# Patient Record
Sex: Female | Born: 1985
Health system: Southern US, Community
[De-identification: ages and names within clinical notes are randomized; demographics above are authoritative.]

## PROBLEM LIST (undated history)

## (undated) DIAGNOSIS — N200 Calculus of kidney: Secondary | ICD-10-CM

## (undated) DIAGNOSIS — N189 Chronic kidney disease, unspecified: Secondary | ICD-10-CM

---

## 2015-02-05 ENCOUNTER — Emergency Department (HOSPITAL_COMMUNITY): Payer: 59

## 2015-02-05 ENCOUNTER — Emergency Department (HOSPITAL_COMMUNITY)
Admission: EM | Admit: 2015-02-05 | Discharge: 2015-02-05 | Disposition: A | Discharge status: Home / Self Care | Payer: 59 | Attending: Emergency Medicine | Admitting: Emergency Medicine

## 2015-02-05 ENCOUNTER — Encounter (HOSPITAL_COMMUNITY): Payer: Self-pay | Admitting: Emergency Medicine

## 2015-02-05 DIAGNOSIS — N23 Unspecified renal colic: Secondary | ICD-10-CM | POA: Diagnosis not present

## 2015-02-05 DIAGNOSIS — Z3202 Encounter for pregnancy test, result negative: Secondary | ICD-10-CM | POA: Insufficient documentation

## 2015-02-05 DIAGNOSIS — R52 Pain, unspecified: Secondary | ICD-10-CM

## 2015-02-05 DIAGNOSIS — R112 Nausea with vomiting, unspecified: Secondary | ICD-10-CM | POA: Insufficient documentation

## 2015-02-05 DIAGNOSIS — M549 Dorsalgia, unspecified: Secondary | ICD-10-CM | POA: Diagnosis present

## 2015-02-05 LAB — URINE MICROSCOPIC-ADD ON

## 2015-02-05 LAB — COMPREHENSIVE METABOLIC PANEL
ALBUMIN: 4.5 g/dL (ref 3.5–5.2)
ALK PHOS: 68 U/L (ref 39–117)
ALT: 32 U/L (ref 0–35)
ANION GAP: 8 (ref 5–15)
AST: 24 U/L (ref 0–37)
BILIRUBIN TOTAL: 0.3 mg/dL (ref 0.3–1.2)
BUN: 16 mg/dL (ref 6–23)
CHLORIDE: 108 mmol/L (ref 96–112)
CO2: 22 mmol/L (ref 19–32)
Calcium: 9.1 mg/dL (ref 8.4–10.5)
Creatinine, Ser: 1.47 mg/dL — ABNORMAL HIGH (ref 0.50–1.10)
GFR calc Af Amer: 55 mL/min — ABNORMAL LOW (ref >90)
GFR calc non Af Amer: 47 mL/min — ABNORMAL LOW (ref >90)
GLUCOSE: 102 mg/dL — AB (ref 70–99)
Potassium: 4.3 mmol/L (ref 3.5–5.1)
SODIUM: 138 mmol/L (ref 135–145)
Total Protein: 8.8 g/dL — ABNORMAL HIGH (ref 6.0–8.3)

## 2015-02-05 LAB — URINALYSIS, ROUTINE W REFLEX MICROSCOPIC
BILIRUBIN URINE: NEGATIVE
GLUCOSE, UA: NEGATIVE mg/dL
Hgb urine dipstick: NEGATIVE
KETONES UR: NEGATIVE mg/dL
Nitrite: NEGATIVE
PROTEIN: NEGATIVE mg/dL
Specific Gravity, Urine: 1.016 (ref 1.005–1.030)
Urobilinogen, UA: 0.2 mg/dL (ref 0.0–1.0)
pH: 6 (ref 5.0–8.0)

## 2015-02-05 LAB — CBC
HEMATOCRIT: 37 % (ref 36.0–46.0)
Hemoglobin: 11.8 g/dL — ABNORMAL LOW (ref 12.0–15.0)
MCH: 28.4 pg (ref 26.0–34.0)
MCHC: 31.9 g/dL (ref 30.0–36.0)
MCV: 88.9 fL (ref 78.0–100.0)
PLATELETS: 289 10*3/uL (ref 150–400)
RBC: 4.16 MIL/uL (ref 3.87–5.11)
RDW: 13.8 % (ref 11.5–15.5)
WBC: 8.5 10*3/uL (ref 4.0–10.5)

## 2015-02-05 LAB — POC URINE PREG, ED: Preg Test, Ur: NEGATIVE

## 2015-02-05 MED ORDER — KETOROLAC TROMETHAMINE 60 MG/2ML IM SOLN
60.0000 mg | Freq: Once | INTRAMUSCULAR | Status: AC
  Administered 2015-02-05: 60 mg via INTRAMUSCULAR
  Filled 2015-02-05: qty 2

## 2015-02-05 MED ORDER — TAMSULOSIN HCL 0.4 MG PO CAPS: 0.4000 mg | ORAL_CAPSULE | Freq: Every day | ORAL | Status: AC

## 2015-02-05 MED ORDER — ONDANSETRON 8 MG PO TBDP: 8.0000 mg | ORAL_TABLET | Freq: Three times a day (TID) | ORAL | Status: AC | PRN

## 2015-02-05 MED ORDER — OXYCODONE-ACETAMINOPHEN 5-325 MG PO TABS: 1.0000 | ORAL_TABLET | ORAL | Status: AC | PRN

## 2015-02-05 NOTE — ED Provider Notes (Signed)
MSE was initiated and I personally evaluated the patient and placed orders (if any) at  10:29 AM on February 05, 2015.  Patient presenting with right-sided low back pain, radiating around her flank. Was seen at urgent care twice for the same, initially diagnosed with UTI, given an IM injection, Zofran and an antibiotic, returned to the urgent care, and was diagnosed with a sinus infection, taken off the antibiotic for the UTI and started on Levaquin. Yesterday, she started to feel better, however this morning, she woke up and the pain returned with associated nausea and vomiting. States she feels dehydrated and has no appetite. I do not feel patient is appropriate for fast track and will be moved to the main side of the emergency department. On exam, she is nontoxic appearing, NAD. Abdomen is soft with right-sided tenderness. No peritoneal signs.  The patient appears stable so that the remainder of the MSE may be completed by another provider.  Kathrynn SpeedRobyn M Bartlomiej Jenkinson, PA-C 02/05/15 [address]  Jonica Bickhart M JohnsonburgHess, PA-C 02/05/15 1031  Shon Batonourtney F Horton, MD 02/05/15 2038

## 2015-02-05 NOTE — ED Provider Notes (Signed)
CSN: [id number]     Arrival date & time 02/05/15  0906 History   First MD Initiated Contact with Patient 02/05/15 [id number]     Chief Complaint  Patient presents with  . Back Pain    right lower  . Nausea  . Emesis     (Consider location/radiation/quality/duration/timing/severity/associated sxs/prior Treatment) HPI Comments: Patient here complaining of right-sided flank pain which is been colicky 2 days. Seen at urgent care diagnosed a UTI place on antibiotics. Went back day later due to musculoskeletal back pain as well as sinus pressure and had her antibiotics switched from Levaquin to Bactrim. She now notes persistent pain in her right flank without fever but with nausea vomiting however no diarrhea. Symptoms are persistent and are not worse with movement. Denies any vaginal bleeding or discharge. No prior history of renal colic  Patient is a 29 y.o. female presenting with back pain and vomiting. The history is provided by the patient.  Back Pain Emesis   History reviewed. No pertinent past medical history. History reviewed. No pertinent past surgical history. History reviewed. No pertinent family history. History  Substance Use Topics  . Smoking status: Never Smoker   . Smokeless tobacco: Not on file  . Alcohol Use: Yes     Comment: occ   OB History    No data available     Review of Systems  Gastrointestinal: Positive for vomiting.  Musculoskeletal: Positive for back pain.  All other systems reviewed and are negative.     Allergies  Review of patient's allergies indicates no known allergies.  Home Medications   Prior to Admission medications   Not on File   BP 140/83 mmHg  Pulse 95  Temp(Src) 99 F (37.2 C) (Oral)  Resp 16  SpO2 100%  LMP 11/28/2014 (Approximate) Physical Exam  Constitutional: She is oriented to person, place, and time. She appears well-developed and well-nourished.  Non-toxic appearance. No distress.  HENT:  Head: Normocephalic and  atraumatic.  Eyes: Conjunctivae, EOM and lids are normal. Pupils are equal, round, and reactive to light.  Neck: Normal range of motion. Neck supple. No tracheal deviation present. No thyroid mass present.  Cardiovascular: Normal rate, regular rhythm and normal heart sounds.  Exam reveals no gallop.   No murmur heard. Pulmonary/Chest: Effort normal and breath sounds normal. No stridor. No respiratory distress. She has no decreased breath sounds. She has no wheezes. She has no rhonchi. She has no rales.  Abdominal: Soft. Normal appearance and bowel sounds are normal. She exhibits no distension. There is no tenderness. There is CVA tenderness. There is no rigidity, no rebound and no guarding.  Musculoskeletal: Normal range of motion. She exhibits no edema or tenderness.  Neurological: She is alert and oriented to person, place, and time. She has normal strength. No cranial nerve deficit or sensory deficit. GCS eye subscore is 4. GCS verbal subscore is 5. GCS motor subscore is 6.  Skin: Skin is warm and dry. No abrasion and no rash noted.  Psychiatric: She has a normal mood and affect. Her speech is normal and behavior is normal.  Nursing note and vitals reviewed.   ED Course  Procedures (including critical care time) Labs Review Labs Reviewed  URINALYSIS, ROUTINE W REFLEX MICROSCOPIC  CBC  COMPREHENSIVE METABOLIC PANEL  POC URINE PREG, ED    Imaging Review No results found.   EKG Interpretation None      MDM   Final diagnoses:  Pain   patient given Toradol here  with some relief. CT results noted and patient will begin referral to urology as well as placed on medications for pain    Lorre Nick, MD 02/05/15 1302

## 2015-02-05 NOTE — Discharge Instructions (Signed)
Ureteral Colic (Kidney Stones) °Ureteral colic is the result of a condition when kidney stones form inside the kidney. Once kidney stones are formed they may move into the tube that connects the kidney with the bladder (ureter). If this occurs, this condition may cause pain (colic) in the ureter.  °CAUSES  °Pain is caused by stone movement in the ureter and the obstruction caused by the stone. °SYMPTOMS  °The pain comes and goes as the ureter contracts around the stone. The pain is usually intense, sharp, and stabbing in character. The location of the pain may move as the stone moves through the ureter. When the stone is near the kidney the pain is usually located in the back and radiates to the belly (abdomen). When the stone is ready to pass into the bladder the pain is often located in the lower abdomen on the side the stone is located. At this location, the symptoms may mimic those of a urinary tract infection with urinary frequency. Once the stone is located here it often passes into the bladder and the pain disappears completely. °TREATMENT  °· Your caregiver will provide you with medicine for pain relief. °· You may require specialized follow-up X-rays. °· The absence of pain does not always mean that the stone has passed. It may have just stopped moving. If the urine remains completely obstructed, it can cause loss of kidney function or even complete destruction of the involved kidney. It is your responsibility and in your interest that X-rays and follow-ups as suggested by your caregiver are completed. Relief of pain without passage of the stone can be associated with severe damage to the kidney, including loss of kidney function on that side. °· If your stone does not pass on its own, additional measures may be taken by your caregiver to ensure its removal. °HOME CARE INSTRUCTIONS  °· Increase your fluid intake. Water is the preferred fluid since juices containing vitamin C may acidify the urine making it  less likely for certain stones (uric acid stones) to pass. °· Strain all urine. A strainer will be provided. Keep all particulate matter or stones for your caregiver to inspect. °· Take your pain medicine as directed. °· Make a follow-up appointment with your caregiver as directed. °· Remember that the goal is passage of your stone. The absence of pain does not mean the stone is gone. Follow your caregiver's instructions. °· Only take over-the-counter or prescription medicines for pain, discomfort, or fever as directed by your caregiver. °SEEK MEDICAL CARE IF:  °· Pain cannot be controlled with the prescribed medicine. °· You have a fever. °· Pain continues for longer than your caregiver advises it should. °· There is a change in the pain, and you develop chest discomfort or constant abdominal pain. °· You feel faint or pass out. °MAKE SURE YOU:  °· Understand these instructions. °· Will watch your condition. °· Will get help right away if you are not doing well or get worse. °Document Released: 07/08/2005 Document Revised: 01/23/2013 Document Reviewed: 03/25/2011 °[employer]® Patient Information ©2015 [employer]. This information is not intended to replace advice given to you by your health care provider. Make sure you discuss any questions you have with your health care provider. ° °

## 2015-02-05 NOTE — ED Notes (Signed)
[facility], states she was dx with UTI given 2 IM injections and given rx for nausea meds and an abx. Patient states on Sunday she went back to UC c/o headache. Patient states on this day she was dx with sinus infection, was told to stop the original abx, was given two new abx and prednisone. Patient states she was feeling better on Monday, but today woke up this am vomiting. Reports two episodes of emesis this am and continues to have low back pain 9/10.

## 2015-02-06 ENCOUNTER — Other Ambulatory Visit: Payer: Self-pay | Admitting: Urology

## 2015-02-07 ENCOUNTER — Encounter (HOSPITAL_COMMUNITY): Payer: Self-pay | Admitting: *Deleted

## 2015-02-11 ENCOUNTER — Encounter (HOSPITAL_COMMUNITY): Payer: Self-pay | Admitting: General Practice

## 2015-02-11 ENCOUNTER — Ambulatory Visit (HOSPITAL_COMMUNITY)
Admission: RE | Admit: 2015-02-11 | Discharge: 2015-02-11 | Disposition: A | Discharge status: Home / Self Care | Payer: 59 | Source: Ambulatory Visit | Attending: Urology | Admitting: Urology

## 2015-02-11 ENCOUNTER — Ambulatory Visit (HOSPITAL_COMMUNITY): Payer: 59

## 2015-02-11 ENCOUNTER — Encounter (HOSPITAL_COMMUNITY)
Admission: RE | Disposition: A | Discharge status: Home / Self Care | Payer: Self-pay | Source: Ambulatory Visit | Attending: Urology

## 2015-02-11 DIAGNOSIS — Z87442 Personal history of urinary calculi: Secondary | ICD-10-CM | POA: Diagnosis not present

## 2015-02-11 DIAGNOSIS — N201 Calculus of ureter: Secondary | ICD-10-CM | POA: Insufficient documentation

## 2015-02-11 DIAGNOSIS — N189 Chronic kidney disease, unspecified: Secondary | ICD-10-CM | POA: Insufficient documentation

## 2015-02-11 HISTORY — DX: Calculus of kidney: N20.0

## 2015-02-11 HISTORY — DX: Chronic kidney disease, unspecified: N18.9

## 2015-02-11 LAB — PREGNANCY, URINE: Preg Test, Ur: NEGATIVE

## 2015-02-11 SURGERY — LITHOTRIPSY, ESWL
Anesthesia: LOCAL | Laterality: Right

## 2015-02-11 MED ORDER — SODIUM CHLORIDE 0.9 % IV SOLN
INTRAVENOUS | Status: DC
  Administered 2015-02-11: 14:00:00 via INTRAVENOUS

## 2015-02-11 MED ORDER — DIAZEPAM 5 MG PO TABS
10.0000 mg | ORAL_TABLET | ORAL | Status: AC
  Administered 2015-02-11: 10 mg via ORAL
  Filled 2015-02-11: qty 2

## 2015-02-11 MED ORDER — CIPROFLOXACIN HCL 500 MG PO TABS
500.0000 mg | ORAL_TABLET | ORAL | Status: AC
  Administered 2015-02-11: 500 mg via ORAL
  Filled 2015-02-11: qty 1

## 2015-02-11 MED ORDER — DIPHENHYDRAMINE HCL 25 MG PO CAPS
25.0000 mg | ORAL_CAPSULE | ORAL | Status: AC
  Administered 2015-02-11: 25 mg via ORAL
  Filled 2015-02-11: qty 1

## 2015-02-11 NOTE — Discharge Instructions (Signed)
1 - You may have urinary urgency (bladder spasms), pass small stone fragements,  and have bloody urine on / off for up to 2 weeks. This is normal.  2 - Call MD or go to ER for fever >102, severe pain / nausea / vomiting not relieved by medications, or acute change in medical status.

## 2015-02-11 NOTE — H&P (Signed)
Desiree MarinerMaggie Guzman is an 29 y.o. female.    Chief Complaint: Pre-OP Right Shockwave Lithotripsy  HPI:   1 -Right Ureteral Stone - 7mm Rt upper fusiform stone by ER CT and office KUB on eval coliky flank pain . Stone 7mm, SSD 13cm, 870 HU and visible L2-L3 interspace on right.  Most recent UCX and UPreg negative.  Today Desiree Guzman is seen to proceed with shockwave lithotripsy.   Past Medical History  Diagnosis Date  . Chronic kidney disease   . Renal stones     History reviewed. No pertinent past surgical history.  History reviewed. No pertinent family history. Social History:  reports that she has never smoked. She does not have any smokeless tobacco history on file. She reports that she drinks alcohol. She reports that she does not use illicit drugs.  Allergies: No Known Allergies  No prescriptions prior to admission    No results found for this or any previous visit (from the past 48 hour(s)). No results found.  Review of Systems  Constitutional: Negative.  Negative for fever, chills and malaise/fatigue.  HENT: Negative.   Eyes: Negative.   Respiratory: Negative.   Cardiovascular: Negative.   Gastrointestinal: Negative.   Genitourinary: Positive for flank pain.  Musculoskeletal: Negative.   Skin: Negative.   Neurological: Negative.   Endo/Heme/Allergies: Negative.   Psychiatric/Behavioral: Negative.     Height 5\' 6"  (1.676 m), weight 112.038 kg (247 lb), last menstrual period 12/08/2014. Physical Exam  Constitutional: She appears well-developed.  HENT:  Head: Normocephalic.  Eyes: Pupils are equal, round, and reactive to light.  Neck: Normal range of motion.  Cardiovascular: Normal rate.   Respiratory: Effort normal.  GI: Soft.  Genitourinary:  Mild Rt CVAT  Neurological: She is alert.  Skin: Skin is warm.  Psychiatric: She has a normal mood and affect. Her behavior is normal. Judgment and thought content normal.     Assessment/Plan  1 -Right Ureteral Stone  - risks and benefits discussed previously by Dr. Sherron MondayMacDiarmid.  Also, we discussed shockwave lithotripsy in detail as well as my "rule of 9s" with stones <839mm, less than 900 HU, and skin to stone distance <9cm having approximately 90% treatment success with single session of treatment. We then addressed how stones that are larger, more dense, and in patients with less favorable anatomy have incrementally decreased success rates. We discussed risks including, bleeding, infection, hematoma, loss of kidney, need for staged therapy, need for adjunctive therapy and requirement to refrain from any anticoagulants, anti-platelet or aspirin-like products peri-procedureally. After careful consideration, the patient has chosen to proceed.   Desiree Guzman 02/11/2015, 6:07 AM

## 2015-02-11 NOTE — Brief Op Note (Signed)
02/11/2015  3:35 PM  PATIENT:  Ashley MarinerMaggie Cleckley  29 y.o. female  PRE-OPERATIVE DIAGNOSIS:  RIGHT URETERAL STONE  POST-OPERATIVE DIAGNOSIS:  * No post-op diagnosis entered *  PROCEDURE:  Procedure(s): RIGHT EXTRACORPOREAL SHOCK WAVE LITHOTRIPSY (ESWL) (Right)  SURGEON:  Surgeon(s) and Role:    * Sebastian Acheheodore Lasharn Bufkin, MD - Primary  PHYSICIAN ASSISTANT:   ASSISTANTS: none   ANESTHESIA:   MAC  EBL:     BLOOD ADMINISTERED:none  DRAINS: none   LOCAL MEDICATIONS USED:  NONE  SPECIMEN:  No Specimen  DISPOSITION OF SPECIMEN:  N/A  COUNTS:  YES  TOURNIQUET:  * No tourniquets in log *  DICTATION: .Note written in paper chart  PLAN OF CARE: Discharge to home after PACU  PATIENT DISPOSITION:  Short Stay   Delay start of Pharmacological VTE agent (>24hrs) due to surgical blood loss or risk of bleeding: yes

## 2016-10-15 IMAGING — CT CT RENAL STONE PROTOCOL
1 series · 15 of 23 positions shown, 19 images · non-contrast
Comparison: None.

CLINICAL DATA: Right-sided pain for 4 days.  Evaluate for stone.

EXAM:
CT ABDOMEN AND PELVIS WITHOUT CONTRAST
TECHNIQUE: Multidetector CT imaging of the abdomen and pelvis was performed
following the standard protocol without IV contrast.

[Series 4: lung · axial · 0.92mm/px · z∈[-245,-145]mm · 15 of 23 slices shown, 19 images]
[im 2/23  soft-tissue]
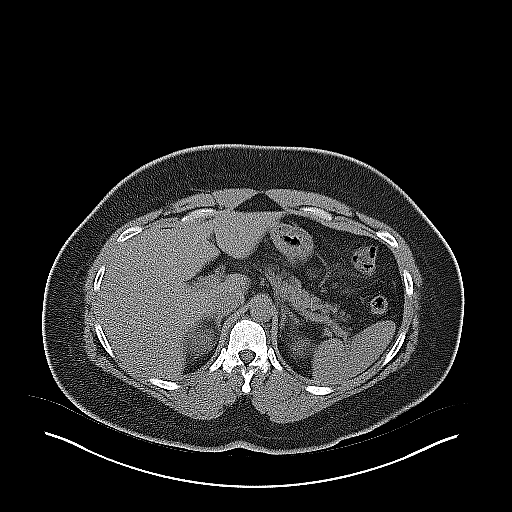
[im 2/23  bone]
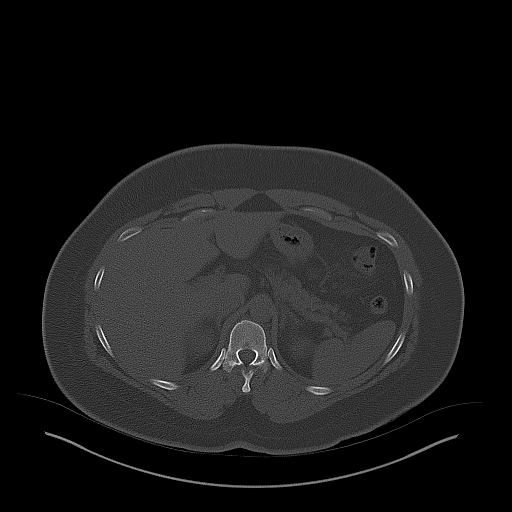
[im 4/23  soft-tissue]
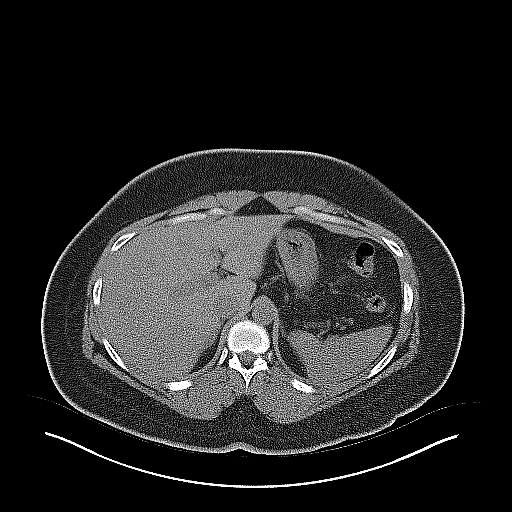
[im 6/23  soft-tissue]
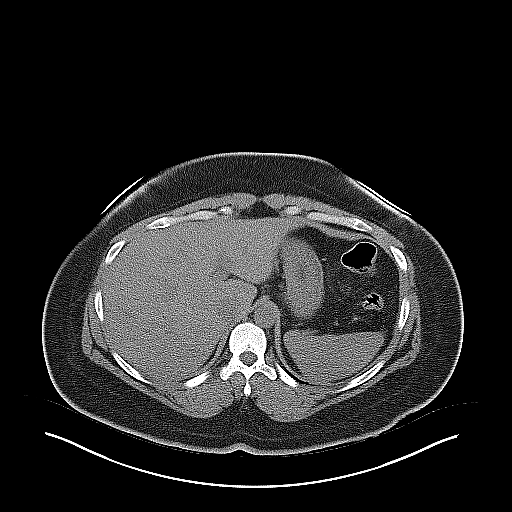
[im 7/23  soft-tissue]
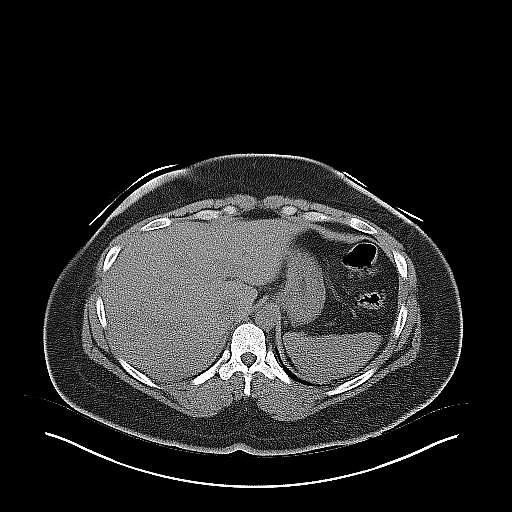
[im 9/23  soft-tissue]
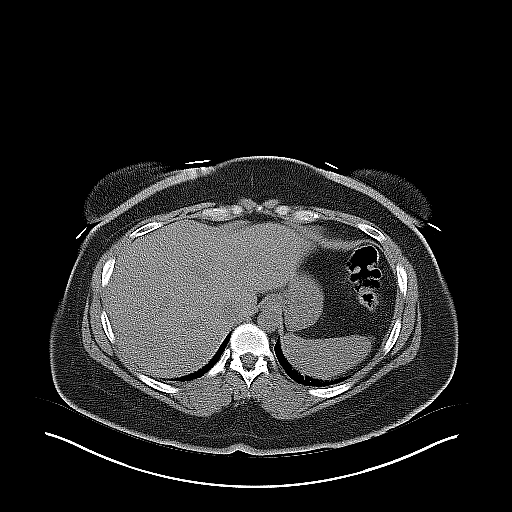
[im 10/23  soft-tissue]
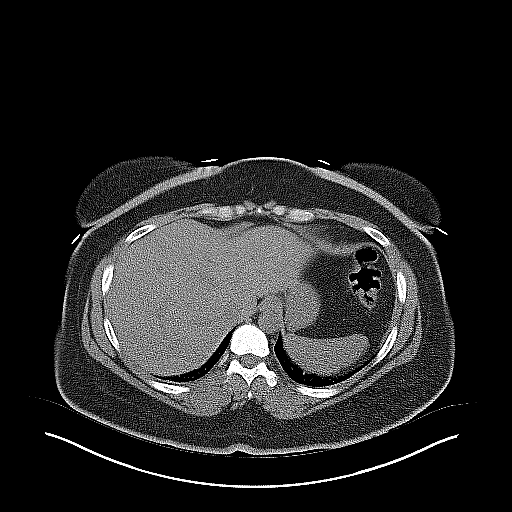
[im 12/23  soft-tissue]
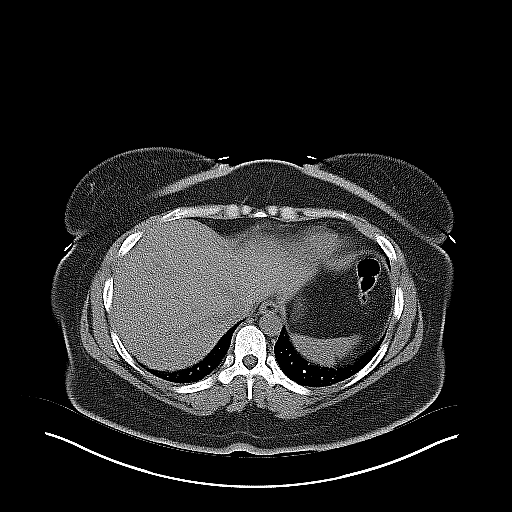
[im 14/23  soft-tissue]
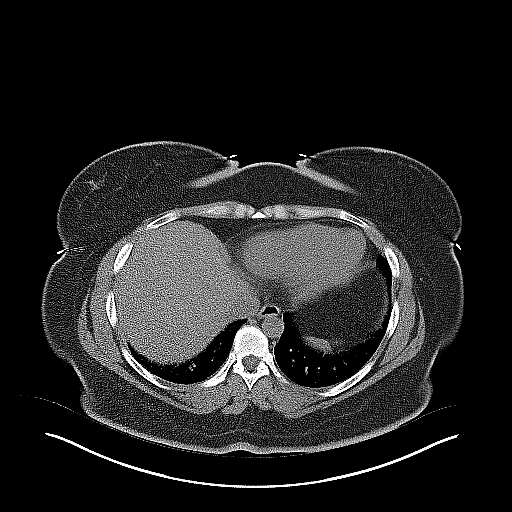
[im 15/23  soft-tissue]
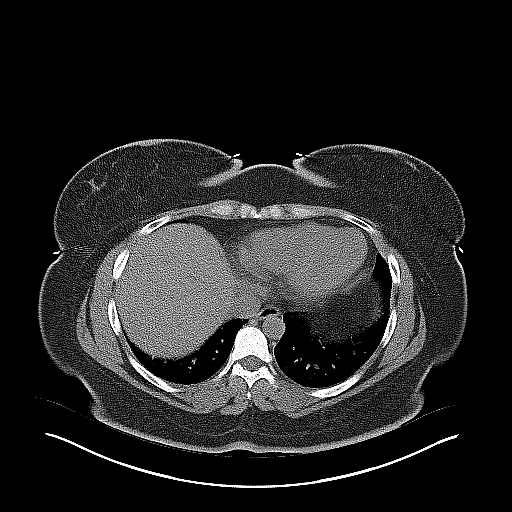
[im 15/23  bone]
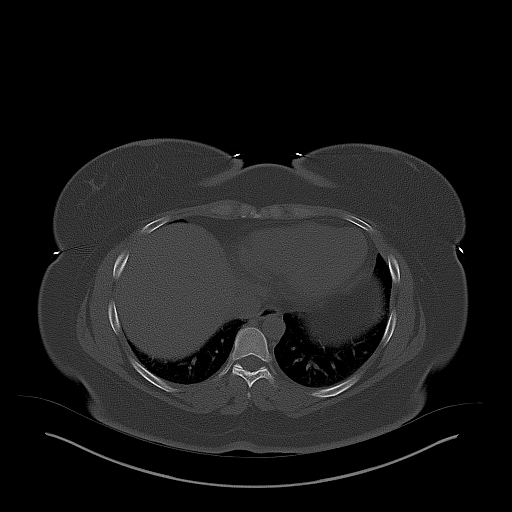
[im 17/23  soft-tissue]
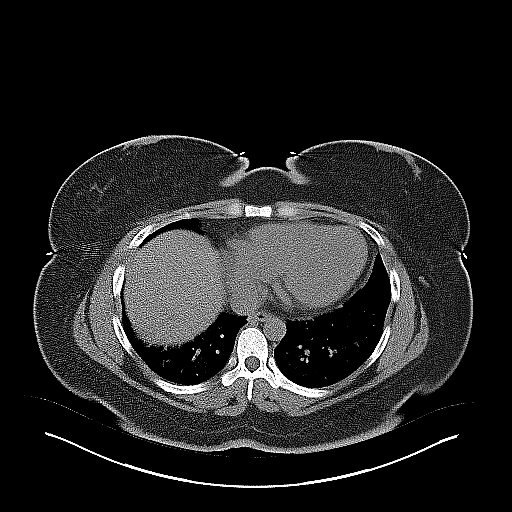
[im 18/23  soft-tissue]
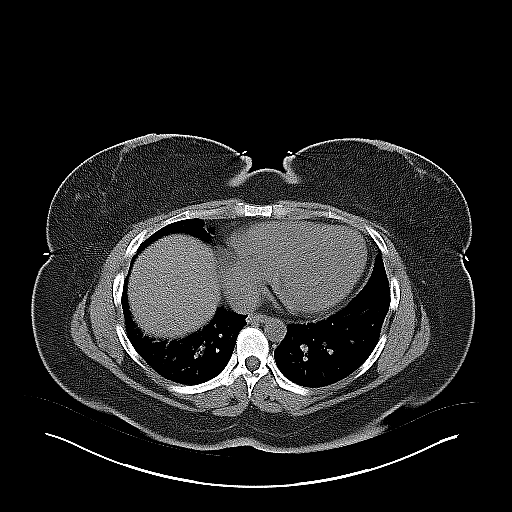
[im 19/23  lung]
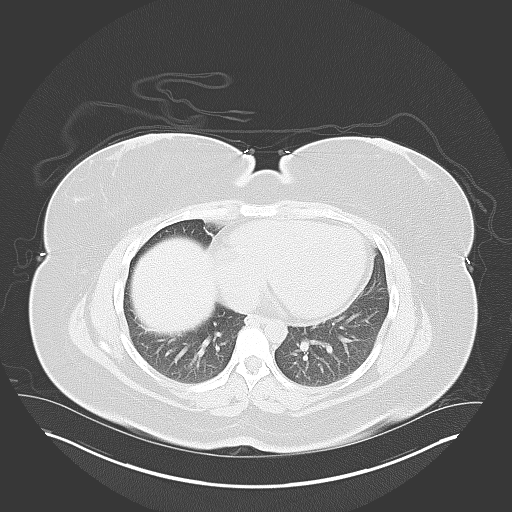
[im 20/23  soft-tissue]
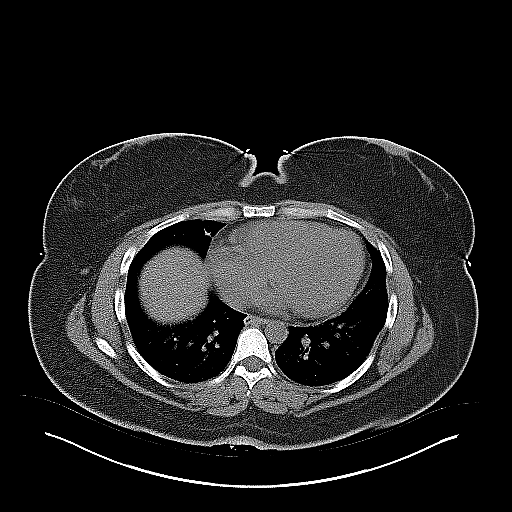
[im 20/23  lung]
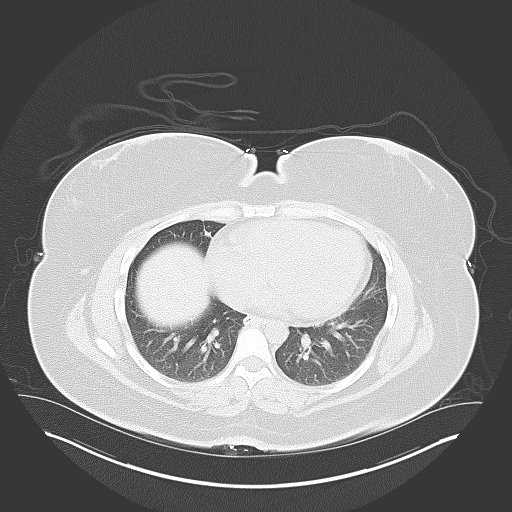
[im 21/23  lung]
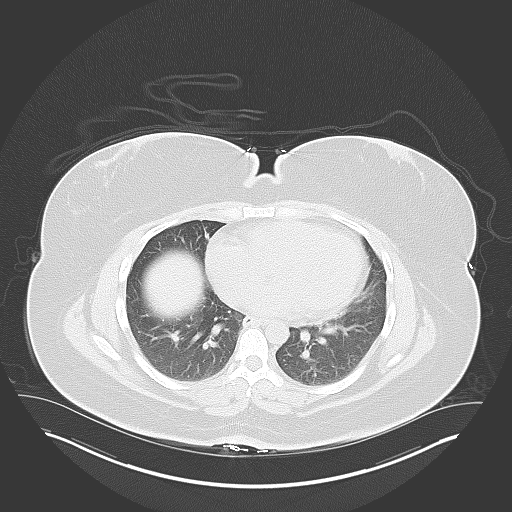
[im 22/23  soft-tissue]
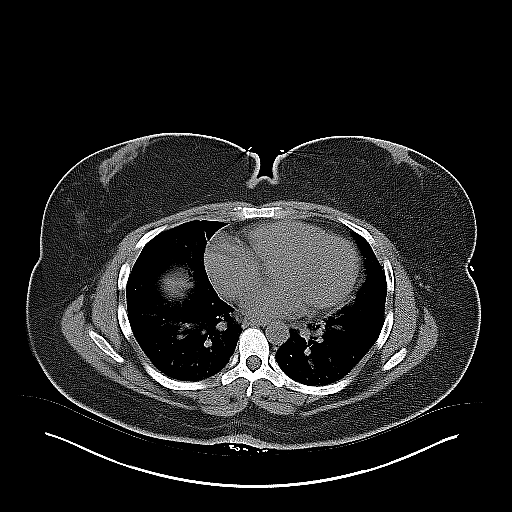
[im 22/23  lung]
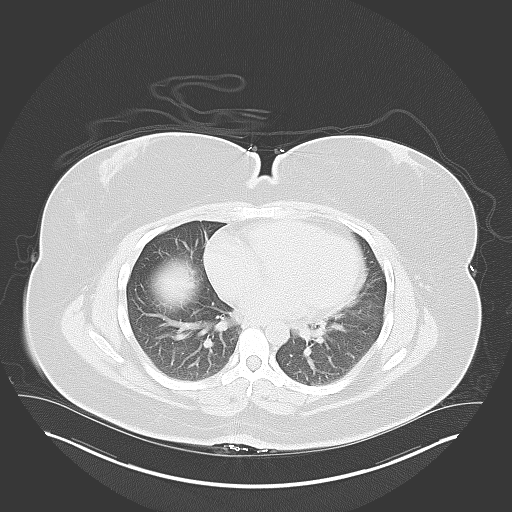

[15 of 23 positions shown; findings below may reference images not displayed]

FINDINGS: BODY WALL: No contributory findings.

LOWER CHEST: No contributory findings.

ABDOMEN/PELVIS:

Liver: No focal abnormality.

Biliary: No evidence of biliary obstruction or stone.

Pancreas: Unremarkable.

Spleen: Unremarkable.

Adrenals: Unremarkable.

Kidneys and ureters: 7 x 4 mm stone in the upper right ureter with
mild hydronephrosis and renal enlargement (measured coronally).
There is a punctate calculus in the lower pole left kidney,
nonobstructive.

Bladder: Unremarkable.

Reproductive: No pathologic findings.

Bowel: No obstruction. Negative appendix.

Retroperitoneum: No mass or adenopathy.

Peritoneum: Trace low-density pelvic fluid, likely physiologic. At
11 mm nodule posterior to the sigmoid colon is in line with the
peritoneum on sagittal imaging. Although strictly indeterminate,
favor a remote torsed epiploic appendage or other benign process.
There is no diffuse peritoneal nodularity.

Vascular: No acute abnormality.

OSSEOUS: No acute abnormalities.
IMPRESSION: 1. 7 x 4 mm stone in the upper right ureter with mild obstructive
changes.
2. Tiny left renal calculus.

## 2016-10-21 IMAGING — CR DG ABDOMEN 1V
1 series · 1 of 1 positions shown · non-contrast
Comparison: 02/06/2015

CLINICAL DATA: Known right ureteral stone

EXAM:
ABDOMEN - 1 VIEW

[t abdomen supine]
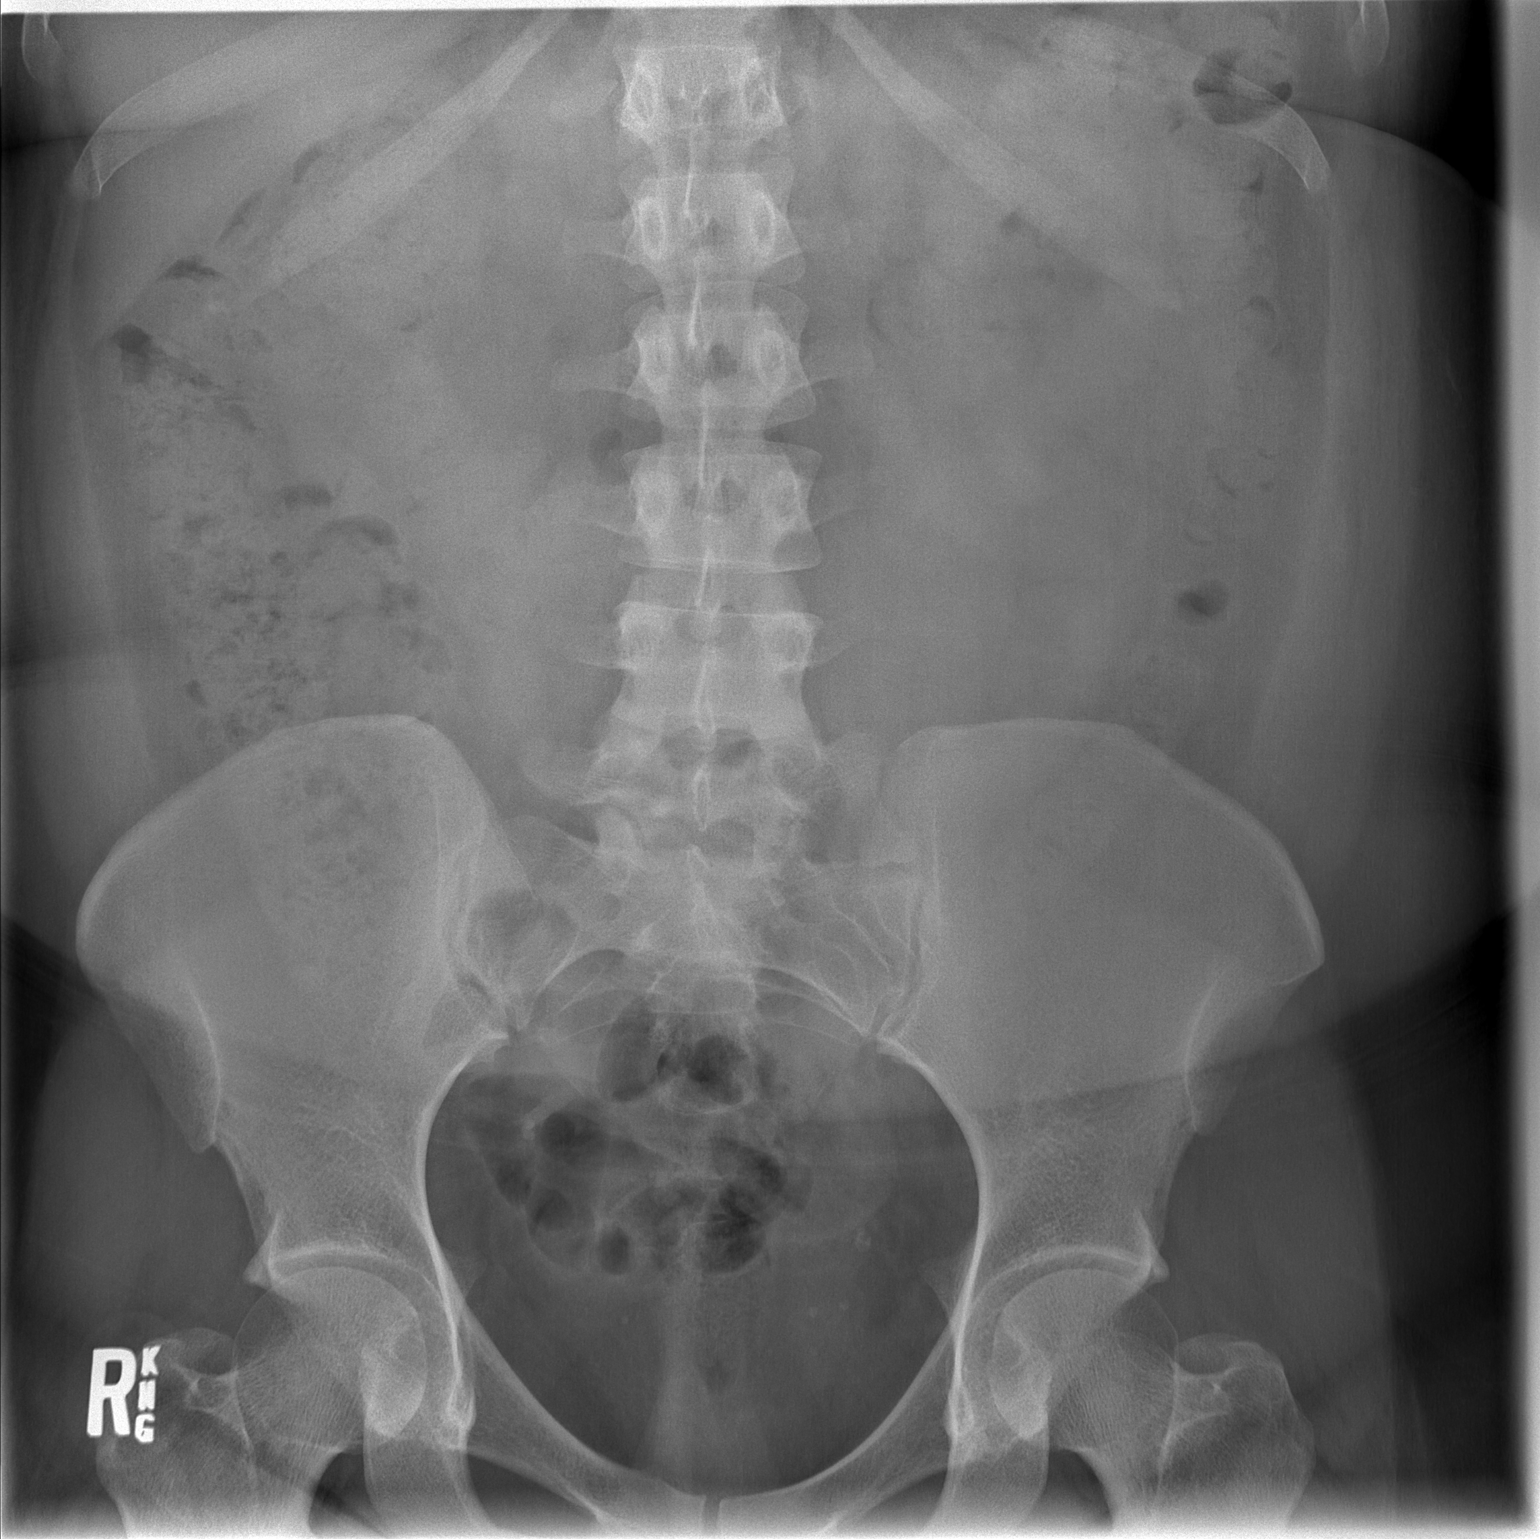

[1 of 1 positions shown; findings below may reference images not displayed]

FINDINGS: Scattered large and small bowel gas is noted. Fecal material is
noted throughout the colon. The previously seen proximal ureteral
stone has migrated distally and appears somewhat smaller than that
seen on the prior exam although this may be related some
magnification difference between the 2 films. No other focal
abnormality is noted.
IMPRESSION: Proximal right ureteral stone is migrated distally now lying within
the distal right ureter.
